# Patient Record
Sex: Male | Born: 2005 | Race: Black or African American | Hispanic: No | Marital: Single
Health system: Southern US, Community
[De-identification: ages and names within clinical notes are randomized; demographics above are authoritative.]

---
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
## 2024-05-02 DIAGNOSIS — S92252A Displaced fracture of navicular [scaphoid] of left foot, initial encounter for closed fracture: Secondary | ICD-10-CM | POA: Diagnosis not present

## 2024-05-02 DIAGNOSIS — S93492A Sprain of other ligament of left ankle, initial encounter: Secondary | ICD-10-CM | POA: Diagnosis not present

## 2024-05-02 DIAGNOSIS — S99912A Unspecified injury of left ankle, initial encounter: Secondary | ICD-10-CM | POA: Diagnosis not present

## 2024-05-02 DIAGNOSIS — S93402A Sprain of unspecified ligament of left ankle, initial encounter: Secondary | ICD-10-CM | POA: Diagnosis not present

## 2024-05-02 DIAGNOSIS — X501XXA Overexertion from prolonged static or awkward postures, initial encounter: Secondary | ICD-10-CM | POA: Diagnosis not present
